# Patient Record
Sex: Male | Born: 2000 | Race: Asian | Hispanic: No | Marital: Single | Smoking: Never smoker
Health system: Southern US, Community
[De-identification: ages and names within clinical notes are randomized; demographics above are authoritative.]

## PROBLEM LIST (undated history)

## (undated) DIAGNOSIS — I429 Cardiomyopathy, unspecified: Secondary | ICD-10-CM

## (undated) HISTORY — PX: TRANSPOSITION OF GREAT VESSELS REPAIR: SHX815

## (undated) HISTORY — PX: OTHER SURGICAL HISTORY: SHX169

---
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
## 2019-02-15 ENCOUNTER — Other Ambulatory Visit: Payer: Self-pay

## 2019-02-15 ENCOUNTER — Encounter: Payer: Self-pay | Admitting: Emergency Medicine

## 2019-02-15 ENCOUNTER — Emergency Department: Payer: BLUE CROSS/BLUE SHIELD

## 2019-02-15 ENCOUNTER — Emergency Department
Admission: EM | Admit: 2019-02-15 | Discharge: 2019-02-15 | Disposition: A | Discharge status: Home / Self Care | Payer: BLUE CROSS/BLUE SHIELD | Attending: Emergency Medicine | Admitting: Emergency Medicine

## 2019-02-15 DIAGNOSIS — R0789 Other chest pain: Secondary | ICD-10-CM | POA: Insufficient documentation

## 2019-02-15 DIAGNOSIS — Z8679 Personal history of other diseases of the circulatory system: Secondary | ICD-10-CM | POA: Insufficient documentation

## 2019-02-15 DIAGNOSIS — R079 Chest pain, unspecified: Secondary | ICD-10-CM

## 2019-02-15 HISTORY — DX: Cardiomyopathy, unspecified: I42.9

## 2019-02-15 LAB — BASIC METABOLIC PANEL
Anion gap: 9 (ref 5–15)
BUN: 22 mg/dL — ABNORMAL HIGH (ref 6–20)
CO2: 24 mmol/L (ref 22–32)
Calcium: 9.5 mg/dL (ref 8.9–10.3)
Chloride: 107 mmol/L (ref 98–111)
Creatinine, Ser: 0.91 mg/dL (ref 0.61–1.24)
GFR calc Af Amer: >60 mL/min (ref >60)
GFR calc non Af Amer: >60 mL/min (ref >60)
Glucose, Bld: 90 mg/dL (ref 70–99)
Potassium: 3.5 mmol/L (ref 3.5–5.1)
Sodium: 140 mmol/L (ref 135–145)

## 2019-02-15 LAB — CBC
HCT: 54.1 % — ABNORMAL HIGH (ref 39.0–52.0)
Hemoglobin: 18.7 g/dL — ABNORMAL HIGH (ref 13.0–17.0)
MCH: 31.3 pg (ref 26.0–34.0)
MCHC: 34.6 g/dL (ref 30.0–36.0)
MCV: 90.5 fL (ref 80.0–100.0)
Platelets: 189 10*3/uL (ref 150–400)
RBC: 5.98 MIL/uL — ABNORMAL HIGH (ref 4.22–5.81)
RDW: 11.9 % (ref 11.5–15.5)
WBC: 6.4 10*3/uL (ref 4.0–10.5)
nRBC: 0 % (ref 0.0–0.2)

## 2019-02-15 LAB — TROPONIN I (HIGH SENSITIVITY)
Troponin I (High Sensitivity): 8 ng/L (ref <18)
Troponin I (High Sensitivity): 10 ng/L (ref <18)

## 2019-02-15 MED ORDER — SODIUM CHLORIDE 0.9% FLUSH: 3.0000 mL | Freq: Once | INTRAVENOUS | Status: DC

## 2019-02-15 NOTE — ED Provider Notes (Addendum)
[facility] Emergency Department Provider Note       Time seen: ----------------------------------------- 11:22 AM on 02/15/2019 -----------------------------------------   I have reviewed the triage vital signs and the nursing notes.  HISTORY   Chief Complaint Chest Pain    HPI Christian Walker is a 18 y.o. male with a history of open heart surgery as a child who presents to the ED for right-sided chest pain with deep breathing.  Patient describes pain 6 out of 10.  He denies any recent illness or other complaints.  Past Medical History:  Diagnosis Date  . 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   Past Surgical History:  Procedure Laterality Date  . open heart surgery     as a child--not sure why    Allergies Patient has no known allergies.  Social History Social History   Tobacco Use  . Smoking status: Never Smoker  . Smokeless tobacco: Never Used  Substance Use Topics  . Alcohol use: Not Currently  . Drug use: Not on file   Review of Systems Constitutional: Negative for fever. Cardiovascular: Positive for chest pain Respiratory: Negative for shortness of breath. Gastrointestinal: Negative for abdominal pain, vomiting and diarrhea. Musculoskeletal: Negative for back pain. Skin: Negative for rash. Neurological: Negative for headaches, focal weakness or numbness.  All systems negative/normal/unremarkable except as stated in the HPI  ____________________________________________   PHYSICAL EXAM:  VITAL SIGNS: ED Triage Vitals  Enc Vitals Group     BP 02/15/19 1025 129/81     Pulse Rate 02/15/19 1025 89     Resp 02/15/19 1025 16     Temp 02/15/19 1025 98.8 F (37.1 C)     Temp Source 02/15/19 1025 Oral     SpO2 02/15/19 1025 96 %     Weight --      Height --      Head Circumference --      Peak Flow --      Pain Score 02/15/19 1026 1     Pain  Loc --      Pain Edu? --      Excl. in [place]? --    Constitutional: Alert and oriented. Well appearing and in no distress. Cardiovascular: Normal rate, regular rhythm.  Systolic murmur Respiratory: Normal respiratory effort without tachypnea nor retractions. Breath sounds are clear and equal bilaterally. No wheezes/rales/rhonchi. Gastrointestinal: Soft and nontender. Normal bowel sounds Musculoskeletal: Nontender with normal range of motion in extremities. No lower extremity tenderness nor edema. Neurologic:  Normal speech and language. No gross focal neurologic deficits are appreciated.  Skin:  Skin is warm, dry and intact. No rash noted. Psychiatric: Mood and affect are normal. Speech and behavior are normal.  ____________________________________________  EKG: Interpreted by me.  Sinus rhythm the rate of 96 bpm, right bundle branch block, right axis deviation, normal QT  ____________________________________________  ED COURSE:  As part of my medical decision making, I reviewed the following data within the [place] History obtained from family if available, nursing notes, old chart and ekg, as well as notes from prior ED visits. Patient presented for chest pain, we will assess with labs and imaging as indicated at this time.   Procedures  Christian Walker was evaluated in Emergency Department on 02/15/2019 for the symptoms described in the history of present illness. He was evaluated in the context of the global COVID-19 pandemic, which  necessitated consideration that the patient might be at risk for infection with the SARS-CoV-2 virus that causes COVID-19. Institutional protocols and algorithms that pertain to the evaluation of patients at risk for COVID-19 are in a state of rapid change based on information released by regulatory bodies including the CDC and federal and state organizations. These policies and algorithms were followed during the patient's care in the ED.   ____________________________________________   LABS (pertinent positives/negatives)  Labs Reviewed  BASIC METABOLIC PANEL - Abnormal; Notable for the following components:      Result Value   BUN 22 (*)    All other components within normal limits  CBC - Abnormal; Notable for the following components:   RBC 5.98 (*)    Hemoglobin 18.7 (*)    HCT 54.1 (*)    All other components within normal limits  TROPONIN I (HIGH SENSITIVITY)  TROPONIN I (HIGH SENSITIVITY)    RADIOLOGY Images were viewed by me  Chest x-ray IMPRESSION: 1. No acute finding. 2. Postoperative heart. ____________________________________________   DIFFERENTIAL DIAGNOSIS   Musculoskeletal pain, GERD, arrhythmia, MI  FINAL ASSESSMENT AND PLAN  Chest pain   Plan: The patient had presented for chest pain. Patient's labs are reassuring. Patient's imaging was reassuring.  I have made an appointment for him with his cardiologist in 4 days.  He is having no symptoms at this time.  Is cleared for outpatient follow-up.   Ulice DashJohnathan E Mikaili Flippin, MD    Note: This note was generated in part or whole with voice recognition software. Voice recognition is usually quite accurate but there are transcription errors that can and very often do occur. I apologize for any typographical errors that were not detected and corrected.     Emily FilbertWilliams, Sandford Diop E, MD 02/15/19 1306    Emily FilbertWilliams, Lazariah Savard E, MD 02/15/19 [phone]

## 2019-02-15 NOTE — ED Triage Notes (Addendum)
Right side chest pain with deep breath.

## 2020-12-03 ENCOUNTER — Ambulatory Visit (HOSPITAL_COMMUNITY)
Admission: EM | Admit: 2020-12-03 | Discharge: 2020-12-03 | Disposition: A | Discharge status: Home / Self Care | Payer: Medicaid Other | Attending: Emergency Medicine | Admitting: Emergency Medicine

## 2020-12-03 ENCOUNTER — Encounter (HOSPITAL_COMMUNITY): Payer: Self-pay | Admitting: Emergency Medicine

## 2020-12-03 ENCOUNTER — Other Ambulatory Visit: Payer: Self-pay

## 2020-12-03 ENCOUNTER — Ambulatory Visit (INDEPENDENT_AMBULATORY_CARE_PROVIDER_SITE_OTHER): Payer: Medicaid Other

## 2020-12-03 DIAGNOSIS — W19XXXA Unspecified fall, initial encounter: Secondary | ICD-10-CM | POA: Diagnosis not present

## 2020-12-03 DIAGNOSIS — S52121A Displaced fracture of head of right radius, initial encounter for closed fracture: Secondary | ICD-10-CM | POA: Diagnosis not present

## 2020-12-03 DIAGNOSIS — M79631 Pain in right forearm: Secondary | ICD-10-CM | POA: Diagnosis not present

## 2020-12-03 NOTE — Discharge Instructions (Signed)
Fracture of your forearm near the elbow seen on x-ray. Wear sling provided until seen by orthopedics. Take Tylenol and/or ibuprofen and apply ice for discomfort.

## 2020-12-03 NOTE — ED Triage Notes (Signed)
Pt presents with right arm pain after slipping off pull up bar at the gym today. States fell on right arm.

## 2020-12-03 NOTE — ED Provider Notes (Signed)
[facility]    CSN: [id number] Arrival date & time: 12/03/20  1314      History   Chief Complaint Chief Complaint  Patient presents with  . Arm Injury    HPI Christian Walker is a 20 y.o. male.   Patient presents with right arm pain after injury around noon today. He states he was at the gym and went to jump to grab a pull up bar. His hand slipped, causing him to fall backwards. He landed with most of his weight on his right arm, landing on his elbow and forearm. He has had pain to his forearm since then, worse with moving his elbow or turning his hand. He denies pain into his wrist or hand or into the shoulder, or any numbness/tingling. The patient is right-handed. He applied ice but hasn't taken anything for it.   The history is provided by the patient.    Past Medical History:  Diagnosis Date  . 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   Past Surgical History:  Procedure Laterality Date  . open heart surgery     as a child--not sure why  . TRANSPOSITION OF GREAT VESSELS REPAIR         Home Medications    Prior to Admission medications   Medication Sig Start Date End Date Taking? Authorizing Provider  aspirin EC 81 MG tablet Take 81 mg by mouth daily.    [provider]    Family History Family History  Problem Relation Age of Onset  . Healthy Mother   . Healthy Father     Social History Social History   Tobacco Use  . Smoking status: Never Smoker  . Smokeless tobacco: Never Used  Substance Use Topics  . Alcohol use: Not Currently     Allergies   Patient has no known allergies.   Review of Systems Review of Systems  Musculoskeletal: Positive for arthralgias. Negative for joint swelling.  Skin: Negative for color change and wound.  Neurological: Negative for weakness and numbness.     Physical Exam Triage Vital Signs ED Triage Vitals  Enc Vitals  Group     BP 12/03/20 1420 127/70     Pulse Rate 12/03/20 1420 85     Resp 12/03/20 1420 16     Temp 12/03/20 1420 98.8 F (37.1 C)     Temp Source 12/03/20 1420 Oral     SpO2 12/03/20 1420 96 %     Weight --      Height --      Head Circumference --      Peak Flow --      Pain Score 12/03/20 1418 5     Pain Loc --      Pain Edu? --      Excl. in [place]? --    No data found.  Updated Vital Signs BP 127/70 (BP Location: Left Arm)   Pulse 85   Temp 98.8 F (37.1 C) (Oral)   Resp 16   SpO2 96%   Visual Acuity Right Eye Distance:   Left Eye Distance:   Bilateral Distance:    Right Eye Near:   Left Eye Near:    Bilateral Near:     Physical Exam Vitals and nursing note reviewed.  Constitutional:      General: He is not in acute distress. HENT:     Head: Normocephalic.  Eyes:     Pupils: Pupils are equal, round, and reactive to light.  Cardiovascular:     Pulses: Normal pulses.  Pulmonary:     Effort: Pulmonary effort is normal.  Musculoskeletal:     Right shoulder: No tenderness. Normal range of motion.     Right upper arm: No tenderness.     Right elbow: Decreased range of motion. Tenderness present.     Right forearm: Tenderness present. No swelling.     Right wrist: No tenderness. Normal range of motion.     Comments: Tenderness to right forearm, mainly to medial epicondyle and along ulna to about mid-shaft. No lateral epicondyle or olecranon tenderness. Full elbow ROM but pain with flexion beyond 45 degrees. Pain with pronation and supination. No pain with wrist flexion/extension or with shoulder ROM.   Skin:    General: Skin is warm.     Findings: No bruising or rash.  Neurological:     General: No focal deficit present.     Mental Status: He is alert.  Psychiatric:        Mood and Affect: Mood normal.      UC Treatments / Results  Labs (all labs ordered are listed, but only abnormal results are displayed) Labs Reviewed - No data to  display  EKG   Radiology DG Forearm Right  Result Date: 12/03/2020 CLINICAL DATA:  Right forearm pain after fall EXAM: RIGHT FOREARM - 2 VIEW COMPARISON:  None. FINDINGS: Frontal and lateral views of the right forearm are obtained. There is a minimally displaced intra-articular right radial head fracture, with associated moderate right elbow effusion. There is dorsal soft tissue swelling of the elbow. The remainder of the right forearm is unremarkable. Right wrist is well aligned. IMPRESSION: 1. Minimally displaced intra-articular right radial head fracture, with associated elbow effusion. Electronically Signed   By: Sharlet Salina M.D.   On: 12/03/2020 15:22    Procedures Procedures (including critical care time)  Medications Ordered in UC Medications - No data to display  Initial Impression / Assessment and Plan / UC Course  I have reviewed the triage vital signs and the nursing notes.  Pertinent labs & imaging results that were available during my care of the patient were reviewed by me and considered in my medical decision making (see chart for details).     Minimally displaced radial head fracture - will sling only given minimal displacement to prevent ROM loss. F/u with ortho - referral placed.   Final Clinical Impressions(s) / UC Diagnoses   Final diagnoses:  Closed displaced fracture of head of right radius, initial encounter     Discharge Instructions     Fracture of your forearm near the elbow seen on x-ray. Wear sling provided until seen by orthopedics. Take Tylenol and/or ibuprofen and apply ice for discomfort.     ED Prescriptions    None     PDMP not reviewed this encounter.   Estanislado Pandy, [place] 12/03/20 1554

## 2020-12-04 ENCOUNTER — Ambulatory Visit (INDEPENDENT_AMBULATORY_CARE_PROVIDER_SITE_OTHER): Payer: Medicaid Other

## 2020-12-04 ENCOUNTER — Ambulatory Visit (INDEPENDENT_AMBULATORY_CARE_PROVIDER_SITE_OTHER): Payer: Medicaid Other | Admitting: Orthopaedic Surgery

## 2020-12-04 ENCOUNTER — Encounter: Payer: Self-pay | Admitting: Orthopaedic Surgery

## 2020-12-04 DIAGNOSIS — S52121A Displaced fracture of head of right radius, initial encounter for closed fracture: Secondary | ICD-10-CM

## 2020-12-04 NOTE — Progress Notes (Signed)
Office Visit Note   Patient: Christian Walker           Date of Birth: [birth year]           MRN: [id number] Visit Date: 12/04/2020              Requested by: Estanislado Pandy, PA [address] [place],  [place] [address] PCP: Pcp, No   Assessment & Plan: Visit Diagnoses:  1. Closed displaced fracture of head of right radius, initial encounter     Plan: Impression is nondisplaced right radial head fracture.  We will continue to immobilize and support in a sling for about 7 to 10 days.  I demonstrated gentle range of motion exercises he can do on his own depending on his symptoms.  I would like to recheck him in 2 weeks with two-view x-rays of the right elbow.  Activity restrictions were reviewed today.  Follow-Up Instructions: Return in about 2 weeks (around 12/18/2020).   Orders:  Orders Placed This Encounter  Procedures  . XR Elbow 2 Views Right   No orders of the defined types were placed in this encounter.     Procedures: No procedures performed   Clinical Data: No additional findings.   Subjective: Chief Complaint  Patient presents with  . 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  He sustained a mechanical fall onto his right elbow yesterday.  He slipped at the gym.  He has had some sharp shooting pain that is worse with movement.  Denies any numbness and tingling.  He is currently wearing a sling.   Review of Systems  Constitutional: Negative.   All other systems reviewed and are negative.    Objective: Vital Signs: There were no vitals taken for this visit.  Physical Exam Vitals and nursing note reviewed.  Constitutional:      Appearance: He is well-developed.  HENT:     Head: Normocephalic and atraumatic.  Eyes:     Pupils: Pupils are equal, round, and reactive to light.  Pulmonary:     Effort: Pulmonary effort is normal.   Abdominal:     Palpations: Abdomen is soft.  Musculoskeletal:        General: Normal range of motion.     Cervical back: Neck supple.  Skin:    General: Skin is warm.  Neurological:     Mental Status: He is alert and oriented to person, place, and time.  Psychiatric:        Behavior: Behavior normal.        Thought Content: Thought content normal.        Judgment: Judgment normal.     Ortho Exam Right elbow shows guarding with attempted range of motion.  I do not feel any mechanical impediments to range of motion.  He is able to flex and extend and pronate and supinate decently. Specialty Comments:  No specialty comments available.  Imaging: DG Forearm Right  Result Date: 12/03/2020 CLINICAL DATA:  Right forearm pain after fall EXAM: RIGHT FOREARM - 2 VIEW COMPARISON:  None. FINDINGS: Frontal and lateral views of the right forearm are obtained. There is a minimally displaced intra-articular right radial head fracture, with associated moderate right elbow effusion. There is dorsal soft tissue swelling of the elbow. The remainder of the right forearm is unremarkable. Right wrist is well aligned.  IMPRESSION: 1. Minimally displaced intra-articular right radial head fracture, with associated elbow effusion. Electronically Signed   By: Sharlet Salina M.D.   On: 12/03/2020 15:22   XR Elbow 2 Views Right  Result Date: 12/04/2020 Nondisplaced radial head fracture    PMFS History: There are no problems to display for this patient.  Past Medical History:  Diagnosis Date  . 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 Healthy Mother   . Healthy Father     Past Surgical History:  Procedure Laterality Date  . open heart surgery     as a child--not sure why  . TRANSPOSITION OF GREAT VESSELS REPAIR     Social History   Occupational History  . Not on file  Tobacco Use  . Smoking status: Never Smoker  .  Smokeless tobacco: Never Used  Substance and Sexual Activity  . Alcohol use: Not Currently  . Drug use: Not on file  . Sexual activity: Not on file

## 2020-12-18 ENCOUNTER — Encounter: Payer: Self-pay | Admitting: Orthopaedic Surgery

## 2020-12-18 ENCOUNTER — Ambulatory Visit (INDEPENDENT_AMBULATORY_CARE_PROVIDER_SITE_OTHER): Payer: Medicaid Other

## 2020-12-18 ENCOUNTER — Ambulatory Visit (INDEPENDENT_AMBULATORY_CARE_PROVIDER_SITE_OTHER): Payer: Medicaid Other | Admitting: Orthopaedic Surgery

## 2020-12-18 ENCOUNTER — Other Ambulatory Visit: Payer: Self-pay

## 2020-12-18 DIAGNOSIS — S52121A Displaced fracture of head of right radius, initial encounter for closed fracture: Secondary | ICD-10-CM

## 2020-12-18 NOTE — Progress Notes (Signed)
   Office Visit Note   Patient: Christian Walker           Date of Birth: 2001-03-06           MRN: [id number] Visit Date: 12/18/2020              Requested by: No referring provider defined for this encounter. PCP: Pcp, No   Assessment & Plan: Visit Diagnoses:  1. Closed displaced fracture of head of right radius, initial encounter     Plan: Patient has done very well in the last couple weeks.  He is still limited to 5 pounds of lifting and no weightbearing through the hand.  We will recheck him in about 4 weeks with two-view x-rays of the right elbow.  Follow-Up Instructions: Return in about 4 weeks (around 01/15/2021).   Orders:  Orders Placed This Encounter  Procedures  . XR Elbow 2 Views Right   No orders of the defined types were placed in this encounter.     Procedures: No procedures performed   Clinical Data: No additional findings.   Subjective: Chief Complaint  Patient presents with  . Right Elbow - Pain    Christian Walker returns today for follow-up of right radial head fracture.  He reports very little pain and doing a lot better.   Review of Systems   Objective: Vital Signs: There were no vitals taken for this visit.  Physical Exam  Ortho Exam Right elbow shows full range of motion with flexion extension pronation supination.  Mild tenderness over the radial head. Specialty Comments:  No specialty comments available.  Imaging: XR Elbow 2 Views Right  Result Date: 12/18/2020 Stable minimally displaced right radial head fracture.    PMFS History: There are no problems to display for this patient.  Past Medical History:  Diagnosis Date  . 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 Healthy Mother   . Healthy Father     Past Surgical History:  Procedure Laterality Date  . open heart surgery     as a child--not sure why  . TRANSPOSITION OF GREAT VESSELS REPAIR     Social  History   Occupational History  . Not on file  Tobacco Use  . Smoking status: Never Smoker  . Smokeless tobacco: Never Used  Substance and Sexual Activity  . Alcohol use: Not Currently  . Drug use: Not on file  . Sexual activity: Not on file

## 2021-01-15 ENCOUNTER — Ambulatory Visit: Payer: 59 | Admitting: Orthopaedic Surgery

## 2021-06-25 ENCOUNTER — Other Ambulatory Visit: Payer: Self-pay

## 2021-06-25 ENCOUNTER — Observation Stay
Admission: EM | Admit: 2021-06-25 | Discharge: 2021-06-26 | Disposition: A | Discharge status: Home / Self Care | Payer: 59 | Attending: Family Medicine | Admitting: Family Medicine

## 2021-06-25 ENCOUNTER — Emergency Department: Payer: 59

## 2021-06-25 ENCOUNTER — Encounter: Payer: Self-pay | Admitting: Emergency Medicine

## 2021-06-25 DIAGNOSIS — J09X2 Influenza due to identified novel influenza A virus with other respiratory manifestations: Secondary | ICD-10-CM | POA: Insufficient documentation

## 2021-06-25 DIAGNOSIS — J9601 Acute respiratory failure with hypoxia: Secondary | ICD-10-CM | POA: Diagnosis not present

## 2021-06-25 DIAGNOSIS — R0602 Shortness of breath: Secondary | ICD-10-CM | POA: Diagnosis present

## 2021-06-25 DIAGNOSIS — Z20822 Contact with and (suspected) exposure to covid-19: Secondary | ICD-10-CM | POA: Insufficient documentation

## 2021-06-25 DIAGNOSIS — R0902 Hypoxemia: Secondary | ICD-10-CM

## 2021-06-25 DIAGNOSIS — Z7982 Long term (current) use of aspirin: Secondary | ICD-10-CM | POA: Insufficient documentation

## 2021-06-25 DIAGNOSIS — J101 Influenza due to other identified influenza virus with other respiratory manifestations: Secondary | ICD-10-CM | POA: Diagnosis present

## 2021-06-25 DIAGNOSIS — J111 Influenza due to unidentified influenza virus with other respiratory manifestations: Secondary | ICD-10-CM

## 2021-06-25 LAB — D-DIMER, QUANTITATIVE: D-Dimer, Quant: 0.3 ug/mL-FEU (ref 0.00–0.50)

## 2021-06-25 LAB — TROPONIN I (HIGH SENSITIVITY)
Troponin I (High Sensitivity): 5 ng/L (ref <18)
Troponin I (High Sensitivity): 6 ng/L (ref <18)

## 2021-06-25 LAB — BASIC METABOLIC PANEL
Anion gap: 13 (ref 5–15)
BUN: 12 mg/dL (ref 6–20)
CO2: 21 mmol/L — ABNORMAL LOW (ref 22–32)
Calcium: 9.1 mg/dL (ref 8.9–10.3)
Chloride: 104 mmol/L (ref 98–111)
Creatinine, Ser: 0.95 mg/dL (ref 0.61–1.24)
GFR, Estimated: >60 mL/min (ref >60)
Glucose, Bld: 158 mg/dL — ABNORMAL HIGH (ref 70–99)
Potassium: 3.5 mmol/L (ref 3.5–5.1)
Sodium: 138 mmol/L (ref 135–145)

## 2021-06-25 LAB — CBC
HCT: 51.3 % (ref 39.0–52.0)
Hemoglobin: 17.5 g/dL — ABNORMAL HIGH (ref 13.0–17.0)
MCH: 31.6 pg (ref 26.0–34.0)
MCHC: 34.1 g/dL (ref 30.0–36.0)
MCV: 92.8 fL (ref 80.0–100.0)
Platelets: 153 10*3/uL (ref 150–400)
RBC: 5.53 MIL/uL (ref 4.22–5.81)
RDW: 12.2 % (ref 11.5–15.5)
WBC: 7.9 10*3/uL (ref 4.0–10.5)
nRBC: 0 % (ref 0.0–0.2)

## 2021-06-25 LAB — PROCALCITONIN: Procalcitonin: <0.1 ng/mL

## 2021-06-25 LAB — RESP PANEL BY RT-PCR (FLU A&B, COVID) ARPGX2
Influenza A by PCR: POSITIVE — AB
Influenza B by PCR: NEGATIVE
SARS Coronavirus 2 by RT PCR: NEGATIVE

## 2021-06-25 LAB — LACTIC ACID, PLASMA: Lactic Acid, Venous: 1.9 mmol/L (ref 0.5–1.9)

## 2021-06-25 MED ORDER — ENOXAPARIN SODIUM 40 MG/0.4ML IJ SOSY: 40.0000 mg | PREFILLED_SYRINGE | INTRAMUSCULAR | Status: DC

## 2021-06-25 MED ORDER — ONDANSETRON HCL 4 MG PO TABS: 4.0000 mg | ORAL_TABLET | Freq: Four times a day (QID) | ORAL | Status: DC | PRN

## 2021-06-25 MED ORDER — HYDROCOD POLST-CPM POLST ER 10-8 MG/5ML PO SUER: 5.0000 mL | Freq: Two times a day (BID) | ORAL | Status: DC | PRN

## 2021-06-25 MED ORDER — ENOXAPARIN SODIUM 40 MG/0.4ML IJ SOSY
40.0000 mg | PREFILLED_SYRINGE | INTRAMUSCULAR | Status: DC
  Administered 2021-06-25: 40 mg via SUBCUTANEOUS
  Filled 2021-06-25: qty 0.4

## 2021-06-25 MED ORDER — ONDANSETRON HCL 4 MG/2ML IJ SOLN: 4.0000 mg | Freq: Four times a day (QID) | INTRAMUSCULAR | Status: DC | PRN

## 2021-06-25 MED ORDER — METHYLPREDNISOLONE SODIUM SUCC 125 MG IJ SOLR
125.0000 mg | Freq: Every day | INTRAMUSCULAR | Status: AC
  Administered 2021-06-25 – 2021-06-26 (×2): 125 mg via INTRAVENOUS
  Filled 2021-06-25 (×2): qty 2

## 2021-06-25 MED ORDER — ACETAMINOPHEN 325 MG PO TABS: 650.0000 mg | ORAL_TABLET | Freq: Four times a day (QID) | ORAL | Status: DC | PRN

## 2021-06-25 MED ORDER — GUAIFENESIN-DM 100-10 MG/5ML PO SYRP
10.0000 mL | ORAL_SOLUTION | ORAL | Status: DC | PRN
  Administered 2021-06-26: 10 mL via ORAL
  Filled 2021-06-25: qty 10

## 2021-06-25 MED ORDER — ACETAMINOPHEN 325 MG PO TABS
650.0000 mg | ORAL_TABLET | Freq: Once | ORAL | Status: AC | PRN
  Administered 2021-06-25: 650 mg via ORAL
  Filled 2021-06-25: qty 2

## 2021-06-25 MED ORDER — ACETAMINOPHEN 650 MG RE SUPP: 650.0000 mg | Freq: Four times a day (QID) | RECTAL | Status: DC | PRN

## 2021-06-25 NOTE — ED Notes (Signed)
Pt ambulatory to bathroom, able to void sans complications. 93% on RA while ambulating.

## 2021-06-25 NOTE — H&P (Addendum)
History and Physical   Amauri Hoang Maish NKN:[id number] DOB: [birth year] DOA: 06/25/2021  PCP: Pcp, No  Patient coming from: Urgent care center  I have personally briefly reviewed patient's old medical records in [facility] EMR.  Chief Concern: shortness of breath  HPI: Christian Walker is a 20 y.o. 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  He tested positive for influenza at [facility]. He was referred the ED due to hypoxia.  [facility] that [facility] his blood oxygen gas was 90%.  In the emergency department at [facility] it was 88% on room air.  He endorsed myalgia, weakness evening of Tuesday, 06/23/21, prompting him to present to the emergency department urgent care center for further evaluation.  Social history: He lives at home with his dad. He denies tobacco, etoh, recreatioanl drug use. 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  Vaccination history: He is vaccinated for covid 19, 2 and 1 booster, Pfizer   ROS: Constitutional: no weight change, no fever ENT/Mouth: no sore throat, no rhinorrhea Eyes: no eye pain, no vision changes Cardiovascular: no chest pain, no dyspnea,  no edema, no palpitations Respiratory: + cough, no sputum, no wheezing Gastrointestinal: no nausea, no vomiting, no diarrhea, no constipation Genitourinary: no urinary incontinence, no dysuria, no hematuria Musculoskeletal: no arthralgias, + myalgias Skin: no skin lesions, no pruritus, Neuro: + weakness, no loss of consciousness, no syncope Psych: no anxiety, no depression, + decrease appetite Heme/Lymph: no bruising, no bleeding  ED Course: Discussed with emergency medicine provider, patient requiring hospitalization for chief concerns of hypoxia.  Vitals in the emergency department showed T-max  of 103, respiration rate of 21, heart rate of 109, blood pressure 130/82, SPO2 of 90% on room air.  Patient has 97% SPO2 on 2 L nasal cannula.  Labs in the emergency department was remarkable for sodium 138, potassium 3.5, chloride 104, bicarb 21, BUN of 12, serum creatinine of 0.95, nonfasting blood glucose 158, WBC 7.9, hemoglobin 17.5, platelets 153.  High since he troponin was 5 and increased to 6.  D-dimer was 0.30.  Lactic acid was 1.9.  Procalcitonin was less than 0.10.  No medical intervention was given in the emergency department.  Assessment/Plan  Active Problems:   Acute hypoxemic respiratory failure (HCC)   Mild acute hypoxia-presumed secondary to influenza - Solu-Medrol 125 mg IV, daily, 2 doses total ordered - Tussionex, Robitussin - COVID/influenza A/influenza B PCR ordered - Tamiflu 75 mg p.o. twice daily ordered, for 5 days  Chart reviewed.   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 He has never used smokeless tobacco. He reports that he does not currently use alcohol. No history on file for drug use.  No Known  Allergies Family History  Problem Relation Age of Onset   Healthy Mother    Healthy Father    Family history: Family history reviewed and not pertinent  Prior to Admission medications   Medication Sig Start Date End Date Taking?  Authorizing Provider  aspirin EC 81 MG tablet Take 81 mg by mouth daily.    [provider]   Physical Exam: Vitals:   06/25/21 1945 06/25/21 2000 06/25/21 2015 06/25/21 2030  BP:      Pulse: 84 84 78 82  Resp: 19 (!) 22 18 20   Temp:      TempSrc:      SpO2: 95% 96% 98% 98%  Weight:      Height:       Constitutional: appears age-appropriate, NAD, calm, comfortable Eyes: PERRL, lids and conjunctivae normal ENMT: Mucous membranes are moist. Posterior pharynx clear of any exudate or lesions. Age-appropriate dentition. Hearing appropriate Neck: normal, supple, no masses, no thyromegaly Respiratory: clear to auscultation bilaterally, no wheezing, no crackles. Normal respiratory effort. No accessory muscle use.  Cardiovascular: Regular rate and rhythm, no murmurs / rubs / gallops. No extremity edema. 2+ pedal pulses. No carotid bruits.  Mid sternal vertical scar consistent with history of open heart surgery with keloid healing Abdomen: no tenderness, no masses palpated, no hepatosplenomegaly. Bowel sounds positive.  Musculoskeletal: no clubbing / cyanosis. No joint deformity upper and lower extremities. Good ROM, no contractures, no atrophy. Normal muscle tone.  Skin: no rashes, lesions, ulcers. No induration.  Multiple upper extremity skin tattoos that appeared to be well-healed. Neurologic: Sensation intact. Strength 5/5 in all 4.  Psychiatric: Normal judgment and insight. Alert and oriented x 3. Normal mood.   EKG: independently reviewed, showing sinus tachycardia with rate of 114, QTc 430  Chest x-ray on Admission: I personally reviewed and I agree with radiologist reading as below.  DG Chest 2 View  Result Date: 06/25/2021 CLINICAL DATA:  Shortness of breath. EXAM: CHEST - 2 VIEW COMPARISON:  02/15/2019 FINDINGS: Both lungs are clear. Again noted are postoperative changes in the heart with median sternotomy wires. Heart size is normal. No pleural effusions. No acute bone  abnormality. IMPRESSION: No active cardiopulmonary disease. Electronically Signed   By: Markus Daft M.D.   On: 06/25/2021 14:59    Labs on Admission: I have personally reviewed following labs CBC: Recent Labs  Lab 06/25/21 1519  WBC 7.9  HGB 17.5*  HCT 51.3  MCV 92.8  PLT 0000000   Basic Metabolic Panel: Recent Labs  Lab 06/25/21 1519  NA 138  K 3.5  CL 104  CO2 21*  GLUCOSE 158*  BUN 12  CREATININE 0.95  CALCIUM 9.1   GFR: Estimated Creatinine Clearance: 118 mL/min (by C-G formula based on SCr of 0.95 mg/dL).  Dr. Tobie Poet [facility]  If 7PM-7AM, please contact overnight-coverage provider If 7AM-7PM, please contact day coverage provider [email or web address]  06/25/2021, 10:37 PM

## 2021-06-25 NOTE — ED Triage Notes (Addendum)
Pt comes into the ED via POV c/o increased SHOB.  Pt was seen at [facility] today where they confirmed he is flu positive.  Pt sent here because his O2 saturation was between 88-90%.  No chest xray performed while at [place].  Pt ambulatory with even and unlabored respirations. Pt also states he has a lot of pain in his chest.  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

## 2021-06-25 NOTE — ED Provider Notes (Signed)
[facility] Emergency Department Provider Note   ____________________________________________   Event Date/Time   First MD Initiated Contact with Patient 06/25/21 1706     (approximate)  I have reviewed the triage vital signs and the nursing notes.   HISTORY  Chief Complaint Shortness of Breath and Chest Pain   HPI Christian Walker is a 20 y.o. male patient reports chest pain and shortness of breath.  Additionally has 103 fever.  He went to urgent care today and was sent here because his oxygen saturation was between 88 and 90%.  He was COVID-negative but flu positive at the urgent care.  He reports he has been sick for several days.  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         Past Medical History:  Diagnosis Date   Heart muscle disorder caused by another medical condition [facility])     Patient Active Problem List   Diagnosis Date Noted   Acute hypoxemic respiratory failure (HCC) 06/25/2021    Past Surgical History:  Procedure Laterality Date   open heart surgery     as a child--not sure why   TRANSPOSITION OF GREAT VESSELS REPAIR      Prior to Admission medications   Medication Sig Start Date End Date Taking? Authorizing Provider  aspirin EC 81 MG tablet Take 81 mg by mouth daily.   Yes [provider]    Allergies Patient has no known allergies.  Family History  Problem Relation Age of Onset   Healthy Mother    Healthy Father     Social History Social History   Tobacco Use   Smoking status: Never   Smokeless tobacco: Never  Substance Use Topics   Alcohol use: Not Currently    Review of Systems  Constitutional:  fever/chills Eyes: No visual changes. ENT: No sore throat. Cardiovascular:  chest  pain. Respiratory: shortness of breath. Gastrointestinal: No abdominal pain.  No nausea, no vomiting.  No diarrhea.  No constipation. Genitourinary: Negative for dysuria. Musculoskeletal: Negative for back pain. Skin: Negative for rash. Neurological: Negative for headaches, focal weakness   ____________________________________________   PHYSICAL EXAM:  VITAL SIGNS: ED Triage Vitals  Enc Vitals Group     BP 06/25/21 1503 130/82     Pulse Rate 06/25/21 1503 (!) 109     Resp 06/25/21 1503 (!) 21     Temp 06/25/21 1503 (!) 103 F (39.4 C)     Temp Source 06/25/21 1503 Oral     SpO2 06/25/21 1503 90 %     Weight 06/25/21 1423 157 lb (71.2 kg)     Height 06/25/21 1423 5\' 8"  (1.727 m)     Head Circumference --      Peak Flow --      Pain Score 06/25/21 1423 5     Pain Loc --      Pain Edu? --      Excl. in GC? --    Constitutional: Alert and oriented. Well appearing and in no acute distress. Eyes: Conjunctivae are normal. PERRL. EOMI. Head: Atraumatic. Nose: No congestion/rhinnorhea. Mouth/Throat: Mucous membranes are moist.  Oropharynx non-erythematous. Neck: No stridor. Cardiovascular: Normal rate, regular rhythm. Grossly normal heart sounds.  Good peripheral circulation.  Large midline scar on chest Respiratory: Normal respiratory effort.  No retractions. Lungs CTAB. Gastrointestinal: Soft and nontender. No distention. No abdominal bruits. No CVA tenderness. Musculoskeletal: No lower extremity tenderness nor edema.   Neurologic:  Normal speech and language. No gross focal neurologic deficits are appreciated. Skin:  Skin is warm, dry and intact. No rash noted. Psychiatric: Mood and affect are normal. Speech and behavior are normal.  ____________________________________________   LABS (all labs ordered are listed, but only abnormal results are displayed)  Labs Reviewed  RESP PANEL BY RT-PCR (FLU A&B, COVID) ARPGX2 - Abnormal; Notable for the following components:       Result Value   Influenza A by PCR POSITIVE (*)    All other components within normal limits  CBC - Abnormal; Notable for the following components:   Hemoglobin 17.5 (*)    All other components within normal limits  BASIC METABOLIC PANEL - Abnormal; Notable for the following components:   CO2 21 (*)    Glucose, Bld 158 (*)    All other components within normal limits  LACTIC ACID, PLASMA  PROCALCITONIN  D-DIMER, QUANTITATIVE (NOT AT [facility])  HIV ANTIBODY (ROUTINE TESTING W REFLEX)  CBC  D-DIMER, QUANTITATIVE  C-REACTIVE PROTEIN  COMPREHENSIVE METABOLIC PANEL  CBC WITH DIFFERENTIAL/PLATELET  TROPONIN I (HIGH SENSITIVITY)  TROPONIN I (HIGH SENSITIVITY)   ____________________________________________  EKG  EKG read interpreted by me shows sinus tachycardia at 114 left axis no acute ST-T wave changes ____________________________________________  RADIOLOGY Gertha Calkin, personally viewed and evaluated these images (plain radiographs) as part of my medical decision making, as well as reviewing the written report by the radiologist.  ED MD interpretation: Chest x-ray read by radiology reviewed by me is negative  Official radiology report(s): DG Chest 2 View  Result Date: 06/25/2021 CLINICAL DATA:  Shortness of breath. EXAM: CHEST - 2 VIEW COMPARISON:  02/15/2019 FINDINGS: Both lungs are clear. Again noted are postoperative changes in the heart with median sternotomy wires. Heart size is normal. No pleural effusions. No acute bone abnormality. IMPRESSION: No active cardiopulmonary disease. Electronically Signed   By: Markus Daft M.D.   On: 06/25/2021 14:59    ____________________________________________   PROCEDURES  Procedure(s) performed (including Critical Care):  Procedures   ____________________________________________   INITIAL IMPRESSION / ASSESSMENT AND PLAN / ED COURSE     Patient with shortness of breath and clear chest x-ray but negative D-dimer and  troponin.  He is positive for the flu.  We will have to get him in the hospital and treat him with oxygen and follow him carefully.  He may end up developing bacterial pneumonia though at the present time with a negative procalcitonin and normal white count this is very unlikely.         ____________________________________________   FINAL CLINICAL IMPRESSION(S) / ED DIAGNOSES  Final diagnoses:  Hypoxia  Influenza     ED Discharge Orders     None        Note:  This document was prepared using Dragon voice recognition software and may include unintentional dictation errors.    Nena Polio, MD 06/26/21 Benancio Deeds

## 2021-06-25 NOTE — ED Notes (Signed)
Lactic acid sent to lab with save label.

## 2021-06-26 ENCOUNTER — Observation Stay: Payer: 59

## 2021-06-26 ENCOUNTER — Encounter: Payer: Self-pay | Admitting: Internal Medicine

## 2021-06-26 DIAGNOSIS — J9601 Acute respiratory failure with hypoxia: Secondary | ICD-10-CM | POA: Diagnosis not present

## 2021-06-26 DIAGNOSIS — J101 Influenza due to other identified influenza virus with other respiratory manifestations: Secondary | ICD-10-CM | POA: Diagnosis not present

## 2021-06-26 LAB — CBC WITH DIFFERENTIAL/PLATELET
Abs Immature Granulocytes: 0.03 10*3/uL (ref 0.00–0.07)
Basophils Absolute: 0 10*3/uL (ref 0.0–0.1)
Basophils Relative: 0 %
Eosinophils Absolute: 0 10*3/uL (ref 0.0–0.5)
Eosinophils Relative: 0 %
HCT: 49.5 % (ref 39.0–52.0)
Hemoglobin: 16.9 g/dL (ref 13.0–17.0)
Immature Granulocytes: 0 %
Lymphocytes Relative: 11 %
Lymphs Abs: 0.8 10*3/uL (ref 0.7–4.0)
MCH: 31.8 pg (ref 26.0–34.0)
MCHC: 34.1 g/dL (ref 30.0–36.0)
MCV: 93.2 fL (ref 80.0–100.0)
Monocytes Absolute: 0.2 10*3/uL (ref 0.1–1.0)
Monocytes Relative: 3 %
Neutro Abs: 6.2 10*3/uL (ref 1.7–7.7)
Neutrophils Relative %: 86 %
Platelets: 141 10*3/uL — ABNORMAL LOW (ref 150–400)
RBC: 5.31 MIL/uL (ref 4.22–5.81)
RDW: 12.3 % (ref 11.5–15.5)
WBC: 7.3 10*3/uL (ref 4.0–10.5)
nRBC: 0 % (ref 0.0–0.2)

## 2021-06-26 LAB — COMPREHENSIVE METABOLIC PANEL
ALT: 22 U/L (ref 0–44)
AST: 28 U/L (ref 15–41)
Albumin: 4.3 g/dL (ref 3.5–5.0)
Alkaline Phosphatase: 51 U/L (ref 38–126)
Anion gap: 9 (ref 5–15)
BUN: 14 mg/dL (ref 6–20)
CO2: 26 mmol/L (ref 22–32)
Calcium: 8.9 mg/dL (ref 8.9–10.3)
Chloride: 104 mmol/L (ref 98–111)
Creatinine, Ser: 0.84 mg/dL (ref 0.61–1.24)
GFR, Estimated: >60 mL/min (ref >60)
Glucose, Bld: 173 mg/dL — ABNORMAL HIGH (ref 70–99)
Potassium: 4.6 mmol/L (ref 3.5–5.1)
Sodium: 139 mmol/L (ref 135–145)
Total Bilirubin: 1.2 mg/dL (ref 0.3–1.2)
Total Protein: 7.8 g/dL (ref 6.5–8.1)

## 2021-06-26 LAB — HEMOGLOBIN A1C
Hgb A1c MFr Bld: 5.8 % — ABNORMAL HIGH (ref 4.8–5.6)
Mean Plasma Glucose: 119.76 mg/dL

## 2021-06-26 LAB — HIV ANTIBODY (ROUTINE TESTING W REFLEX): HIV Screen 4th Generation wRfx: NONREACTIVE

## 2021-06-26 LAB — VITAMIN D 25 HYDROXY (VIT D DEFICIENCY, FRACTURES): Vit D, 25-Hydroxy: 11.84 ng/mL — ABNORMAL LOW (ref 30–100)

## 2021-06-26 LAB — C-REACTIVE PROTEIN: CRP: 5.3 mg/dL — ABNORMAL HIGH (ref <1.0)

## 2021-06-26 LAB — D-DIMER, QUANTITATIVE: D-Dimer, Quant: 0.51 ug/mL-FEU — ABNORMAL HIGH (ref 0.00–0.50)

## 2021-06-26 MED ORDER — OSELTAMIVIR PHOSPHATE 75 MG PO CAPS
75.0000 mg | ORAL_CAPSULE | Freq: Two times a day (BID) | ORAL | Status: DC
  Administered 2021-06-26 (×2): 75 mg via ORAL
  Filled 2021-06-26 (×3): qty 1

## 2021-06-26 MED ORDER — VITAMIN D (ERGOCALCIFEROL) 1.25 MG (50000 UNIT) PO CAPS: 50000.0000 [IU] | ORAL_CAPSULE | ORAL | 0 refills | Status: DC

## 2021-06-26 MED ORDER — OSELTAMIVIR PHOSPHATE 75 MG PO CAPS: 75.0000 mg | ORAL_CAPSULE | Freq: Two times a day (BID) | ORAL | 0 refills | Status: DC

## 2021-06-26 MED ORDER — VITAMIN D (ERGOCALCIFEROL) 1.25 MG (50000 UNIT) PO CAPS: 50000.0000 [IU] | ORAL_CAPSULE | ORAL | 0 refills | Status: AC

## 2021-06-26 MED ORDER — IOHEXOL 350 MG/ML SOLN
75.0000 mL | Freq: Once | INTRAVENOUS | Status: AC | PRN
  Administered 2021-06-26: 75 mL via INTRAVENOUS
  Filled 2021-06-26: qty 75

## 2021-06-26 MED ORDER — ASCORBIC ACID 500 MG PO TABS
1000.0000 mg | ORAL_TABLET | Freq: Every day | ORAL | Status: DC
  Administered 2021-06-26: 1000 mg via ORAL
  Filled 2021-06-26: qty 2

## 2021-06-26 MED ORDER — OSELTAMIVIR PHOSPHATE 75 MG PO CAPS: 75.0000 mg | ORAL_CAPSULE | Freq: Two times a day (BID) | ORAL | 0 refills | Status: AC

## 2021-06-26 NOTE — Progress Notes (Signed)
IV removed from patient. Discharge instructions given to patient. Verbalized understanding. No acute distress at this time. Patient escorted out to ED Lobby.

## 2021-06-26 NOTE — Progress Notes (Signed)
Patient ambulated to BR, O2 sats 92% on RA with exertion, tolerated well.

## 2021-06-26 NOTE — Discharge Summary (Signed)
Physician Discharge Summary  Coloma Wysocki MIW:[id number] DOB: [birth year] DOA: 06/25/2021  PCP: Pcp, No  Admit date: 06/25/2021 Discharge date: 06/26/2021  Time spent: 40 minutes  Recommendations for Outpatient Follow-up:  Follow outpatient CBC/CMP Follow a1c outpatient Follow vitamin D outpatient Ensure he establishes with PCP    Discharge Diagnoses:  Active Problems:   Acute hypoxemic respiratory failure (HCC)   Influenza Christian Walker   Discharge Condition: stable  Diet recommendation: heart healthy  Filed Weights   06/25/21 1423  Weight: 71.2 kg    History of present illness:  Christian Walker is Christian Walker 20 y.o. male with medical history significant for congenital heart disease status post transposition of great vessels repair, at age 55, and 2 more subsequent revisions, who [facility].   He tested positive for influenza at [facility]. He was referred the ED due to hypoxia.  [facility] blood oxygen gas was 90%.  In the emergency department at [facility] it was 88% on room air.   He was admitted for treatment of acute hypoxic resp failure 2/2 influenza Ed Rayson.  He's improved.  Plan for discharge and outpatient follow up.  Hospital Course:  Acute Hypoxic Respiratory Failure 2/2 Influenza Mica Releford Resolved - maintaining sats on RA with ambulation, feeling much better this AM Continue tamiflu CT PE study given elevated d dimer without findings concerning for PE Plan for discharge with tamiflu and symptomatic treatment Elevated inflammatory markers, d dimer, likely related to inflammation with flu  Vit D def Prescribe vit d replacement, needs outpatient follow up  Elevated Blood Sugar Possibly related to steroids A1c pending Needs to follow this outpatient  Hx Congential heart disease s/p transposition of great vessels repair  Needs PCP, discussed with RN to arrange with  TOC  Procedures: none  Consultations: none  Discharge Exam: Vitals:   06/26/21 0600 06/26/21 0800  BP: 117/67 111/65  Pulse: 68 68  Resp: 17 16  Temp:    SpO2: 96% 94%   Feels better, ready for dischrge  General: No acute distress. Cardiovascular: Heart sounds show Willette Mudry regular rate, and rhythm. Lungs: Clear to auscultation bilaterally Abdomen: Soft, nontender, nondistended  Neurological: Alert and oriented 3. Moves all extremities 4 . Cranial nerves II through XII grossly intact. Skin: Warm and dry. No rashes or lesions. Extremities: No clubbing or cyanosis. No edema.   Discharge Instructions   Discharge Instructions     Call MD for:  difficulty breathing, headache or visual disturbances   Complete by: As directed    Call MD for:  extreme fatigue   Complete by: As directed    Call MD for:  hives   Complete by: As directed    Call MD for:  persistant dizziness or light-headedness   Complete by: As directed    Call MD for:  persistant nausea and vomiting   Complete by: As directed    Call MD for:  redness, tenderness, or signs of infection (pain, swelling, redness, odor or green/yellow discharge around incision site)   Complete by: As directed    Call MD for:  severe uncontrolled pain   Complete by: As directed    Call MD for:  temperature >100.4   Complete by: As directed    Diet - low sodium heart healthy   Complete by: As directed    Discharge instructions   Complete by: As directed    You were seen for shortness of  breath due to influenza [name].  Your symptoms are improved at this time.  We'll send you home with tamiflu for 4 more days.  Use ibuprofen or tylenol as needed for fever or muscle aches.   Your CT scan did not have concerning findings.  You blood sugar was high, this was probably due to steroids.  We sent an A1c which is pending.   You have low vitamin D levels.  I've sent you with Rogerio Boutelle prescription for once weekly vitamin D.  You'll need follow up  with an outpatient doctor for further evaluation.    Please establish with Ajay Strubel PCP and follow up this hospital stay.  Return for new, recurrent, or worsening symptoms.  Please ask your PCP to request records from this hospitalization so they know what was done and what the next steps will be.   Increase activity slowly   Complete by: As directed       Allergies as of 06/26/2021   No Known Allergies      Medication List     TAKE these medications    aspirin EC 81 MG tablet Take 81 mg by mouth daily.   oseltamivir 75 MG capsule Commonly known as: TAMIFLU Take 1 capsule (75 mg total) by mouth 2 (two) times daily for 4 days.   Vitamin D (Ergocalciferol) 1.25 MG (50000 UNIT) Caps capsule Commonly known as: DRISDOL Take 1 capsule (50,000 Units total) by mouth every 7 (seven) days for 6 doses.       No Known Allergies    The results of significant diagnostics from this hospitalization (including imaging, microbiology, ancillary and laboratory) are listed below for reference.    Significant Diagnostic Studies: DG Chest 2 View  Result Date: 06/25/2021 CLINICAL DATA:  Shortness of breath. EXAM: CHEST - 2 VIEW COMPARISON:  02/15/2019 FINDINGS: Both lungs are clear. Again noted are postoperative changes in the heart with median sternotomy wires. Heart size is normal. No pleural effusions. No acute bone abnormality. IMPRESSION: No active cardiopulmonary disease. Electronically Signed   By: Richarda Overlie M.D.   On: 06/25/2021 14:59   CT Angio Chest Pulmonary Embolism (PE) W or WO Contrast  Result Date: 06/26/2021 CLINICAL DATA:  19 year old male with history of shortness of breath. Evaluate for pulmonary embolism. EXAM: CT ANGIOGRAPHY CHEST WITH CONTRAST TECHNIQUE: Multidetector CT imaging of the chest was performed using the standard protocol during bolus administration of intravenous contrast. Multiplanar CT image reconstructions and MIPs were obtained to evaluate the vascular anatomy.  CONTRAST:  69mL OMNIPAQUE IOHEXOL 350 MG/ML SOLN COMPARISON:  No priors. FINDINGS: Cardiovascular: Complex congenital heart disease is noted. Specifically, there is d-transposition of the great arteries, as well as large atrial septal defect (axial image 191 of series 6), and large ventricular septal defect (axial image 187 of series 6). There appears to have been prior atrial switch operation, with Gilles Trimpe baffle which returns blood flow from the inferior vena cava into the pulmonic trunk directed toward the left main pulmonary artery. Superior vena cava connects to the pulmonic trunk with blood flow directed into the right main pulmonary artery. This causes unusual mixing of opacified and unopacified blood throughout the pulmonary arterial circulation. With these limitations in mind, no definite pulmonary embolism is identified. The systemic ventricle (right ventricle) demonstrates hypertrophy. No pericardial fluid, thickening or pericardial calcification. No atherosclerotic calcifications in the thoracic aorta or the coronary arteries. 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 Mediastinum/Nodes: No pathologically enlarged mediastinal or hilar lymph nodes. Esophagus is unremarkable in appearance. No axillary lymphadenopathy. Lungs/Pleura: Dependent areas of subsegmental atelectasis are noted in the lower lobes of the lungs bilaterally. No consolidative airspace disease. No pleural effusions. No definite suspicious appearing pulmonary nodules or masses are noted. Upper Abdomen: Visualized portions are unremarkable. Specifically, there is normal abdominal situs. Musculoskeletal: Median sternotomy wires. There are no aggressive appearing lytic or blastic lesions noted in the visualized portions of the skeleton. Review of the MIP images confirms the above findings. IMPRESSION: 1. Complex  congenital heart disease (transposition of the great vessels with large atrial septal and ventricular septal defects status post correction with atrial switch procedure, which confound today's study. With these limitations in mind, no definite pulmonary embolism is identified, with discussion, as above. 2. Mild dependent subsegmental atelectasis in the lower lobes of the lungs bilaterally. Electronically Signed   By: Trudie Reed M.D.   On: 06/26/2021 10:09    Microbiology: Recent Results (from the past 240 hour(s))  Resp Panel by RT-PCR (Flu Felipa Laroche&B, Covid) Nasopharyngeal Swab     Status: Abnormal   Collection Time: 06/25/21  9:23 PM   Specimen: Nasopharyngeal Swab; Nasopharyngeal(NP) swabs in vial transport medium  Result Value Ref Range Status   SARS Coronavirus 2 by RT PCR NEGATIVE NEGATIVE Final    Comment: (NOTE) SARS-CoV-2 target nucleic acids are NOT DETECTED.  The SARS-CoV-2 RNA is generally detectable in upper respiratory specimens during the acute phase of infection. The lowest concentration of SARS-CoV-2 viral copies this assay can detect is 138 copies/mL. Greysen Devino negative result does not preclude SARS-Cov-2 infection and should not be used as the sole basis for treatment or other patient management decisions. Jaselynn Tamas negative result may occur with  improper specimen collection/handling, submission of specimen other than nasopharyngeal swab, presence of viral mutation(s) within the areas targeted by this assay, and inadequate number of viral copies(<138 copies/mL). Jlon Betker negative result must be combined with clinical observations, patient history, and epidemiological information. The expected result is Negative.  Fact Sheet for Patients:  BloggerCourse.com  Fact Sheet for Healthcare Providers:  SeriousBroker.it  This test is no t yet approved or cleared by the [place] FDA and  has been authorized for detection and/or diagnosis of  SARS-CoV-2 by FDA under an Emergency Use Authorization (EUA). This EUA will remain  in effect (meaning this test can be used) for the duration of the COVID-19 declaration under Section 564(b)(1) of the Act, 21 U.S.C.section 360bbb-3(b)(1), unless the authorization is terminated  or revoked sooner.       Influenza Eunice Winecoff by PCR POSITIVE (Kobe Ofallon) NEGATIVE Final   Influenza B by PCR NEGATIVE NEGATIVE Final    Comment: (NOTE) The Xpert Xpress SARS-CoV-2/FLU/RSV plus assay is intended as an aid in the diagnosis of influenza from Nasopharyngeal swab specimens and should not be used as Shailene Demonbreun sole basis for treatment. Nasal washings and aspirates are unacceptable for Xpert Xpress SARS-CoV-2/FLU/RSV testing.  Fact Sheet for Patients: BloggerCourse.com  Fact Sheet for Healthcare Providers: SeriousBroker.it  This test is not yet approved or cleared by the [place] FDA and has been authorized for detection and/or diagnosis of SARS-CoV-2 by FDA under an Emergency Use Authorization (EUA). This EUA will remain in effect (meaning this test can be used) for the duration of the COVID-19 declaration under Section 564(b)(1) of the Act, 21 U.S.C. section 360bbb-3(b)(1), unless the authorization is terminated or revoked.  Performed  at [facility], [address] [place], [place] [address]      Labs: Basic Metabolic Panel: Recent Labs  Lab 06/25/21 1519 06/26/21 0433  NA 138 139  K 3.5 4.6  CL 104 104  CO2 21* 26  GLUCOSE 158* 173*  BUN 12 14  CREATININE 0.95 0.84  CALCIUM 9.1 8.9   Liver Function Tests: Recent Labs  Lab 06/26/21 0433  AST 28  ALT 22  ALKPHOS 51  BILITOT 1.2  PROT 7.8  ALBUMIN 4.3   No results for input(s): LIPASE, AMYLASE in the last 168 hours. No results for input(s): AMMONIA in the last 168 hours. CBC: Recent Labs  Lab 06/25/21 1519 06/26/21 0433  WBC 7.9 7.3  NEUTROABS  --  6.2  HGB 17.5* 16.9   HCT 51.3 49.5  MCV 92.8 93.2  PLT 153 141*   Cardiac Enzymes: No results for input(s): CKTOTAL, CKMB, CKMBINDEX, TROPONINI in the last 168 hours. BNP: BNP (last 3 results) No results for input(s): BNP in the last 8760 hours.  ProBNP (last 3 results) No results for input(s): PROBNP in the last 8760 hours.  CBG: No results for input(s): GLUCAP in the last 168 hours.     Signed:  Lacretia Nicks MD.  [facility] 06/26/2021, 11:37 AM

## 2021-06-26 NOTE — TOC Initial Note (Addendum)
Transition of Care [facility]) - Initial/Assessment Note    Patient Details  Name: Christian Walker MRN: [id number] Date of Birth: [birth year]  [facility]) CM/SW Contact:    Marina Goodell Phone Number: [phone] 06/26/2021, 12:16 PM  Clinical Narrative:                  Patient presents to [facility] due to  Acute hypoxemic respiratory failure (HCC and Influenza A. Patient is independent with all ADLs and is a Physicist, medical at [employer]. CSW gave patient contact information for PCPs in the area that take his insurance and are accepting new patients and  CSW also encouraged the patient to use the student clinic at [facility] if that was easier to access. CSW spoke with patient who stated he would contact PCPs to schedule an appointment. CSW updated ED RN.  Expected Discharge Plan: Home/Self Care Barriers to Discharge: ED PCP establishment   Patient Goals and CMS Choice Patient states their goals for this hospitalization and ongoing recovery are:: Schedule appointment with PCP      Expected Discharge Plan and Services Expected Discharge Plan: Home/Self Care In-house Referral: Clinical Social Work       Expected Discharge Date: 06/26/21                                    Prior Living Arrangements/Services   Lives with:: Parents (Osmond,hoang (Father)   [phone] (Mobile)) Patient language and need for interpreter reviewed:: Yes Do you feel safe going back to the place where you live?: Yes      Need for Family Participation in Patient Care: No (Comment) Care giver support system in place?: Yes (comment)   Criminal Activity/Legal Involvement Pertinent to Current Situation/Hospitalization: No - Comment as needed  Activities of Daily Living      Permission Sought/Granted Permission sought to share information with : Family Supports Permission granted to share information with : Yes, Verbal Permission Granted  Share Information with NAME: Doucette,hoang (Father)    [phone] (Mobile)           Emotional Assessment Appearance:: Appears stated age   Affect (typically observed): Stable Orientation: : Oriented to  Time, Oriented to Place, Oriented to Self Alcohol / Substance Use: Not Applicable Psych Involvement: No (comment)  Admission diagnosis:  Acute hypoxemic respiratory failure (HCC) [J96.01] Patient Active Problem List   Diagnosis Date Noted   Influenza A 06/26/2021   Acute hypoxemic respiratory failure (HCC) 06/25/2021   PCP:  Pcp, No Pharmacy:   [id number] - Closed - [place], [place] - [address] [place] [address] Phone: [phone] Fax: [phone]     Social Determinants of Health (SDOH) Interventions    Readmission Risk Interventions No flowsheet data found.

## 2023-02-11 IMAGING — CT CT ANGIO CHEST
2 of 6 series · 18 of 46 positions shown · IV contrast (APPLIED)
Comparison: No priors.

CLINICAL DATA: 20-year-old male with history of shortness of
breath. Evaluate for pulmonary embolism.

EXAM:
CT ANGIOGRAPHY CHEST WITH CONTRAST
TECHNIQUE: Multidetector CT imaging of the chest was performed using the
standard protocol during bolus administration of intravenous
contrast. Multiplanar CT image reconstructions and MIPs were
obtained to evaluate the vascular anatomy.
CONTRAST:  75mL OMNIPAQUE IOHEXOL 350 MG/ML SOLN

[Series 6: thins · axial · 0.58mm/px · z∈[+646,+906]mm · 15 of 356 slices shown]
[im 15/356  lung]
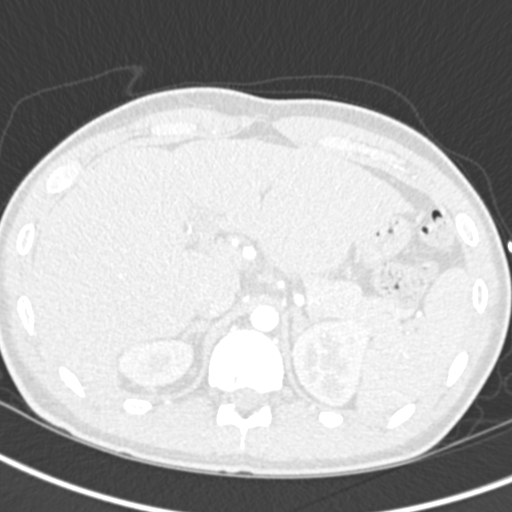
[im 45/356  soft-tissue]
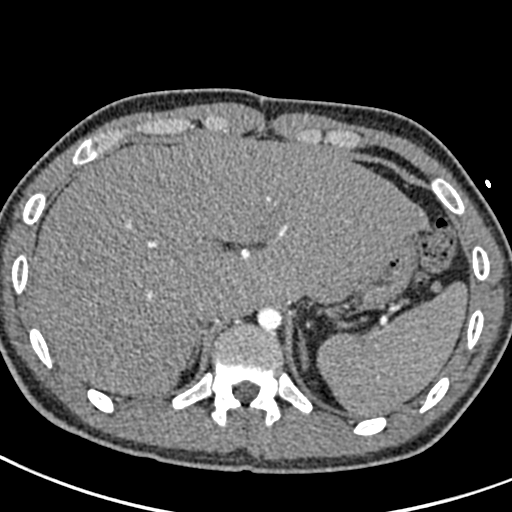
[im 60/356  lung]
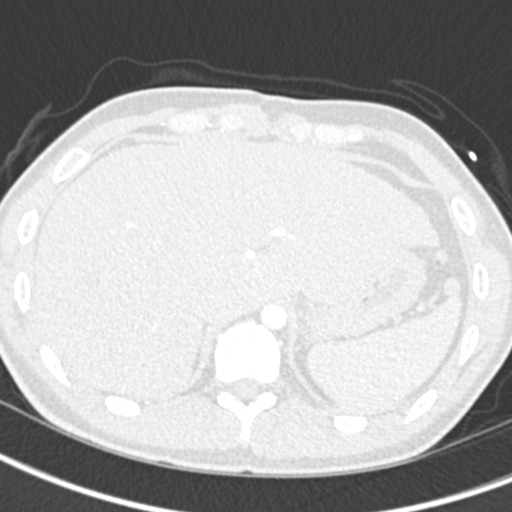
[im 89/356  soft-tissue]
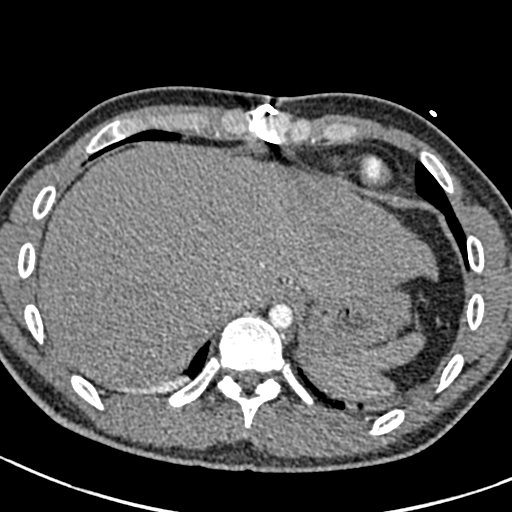
[im 104/356  lung]
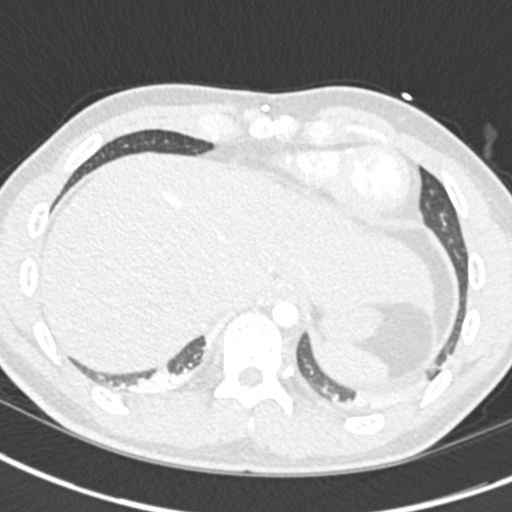
[im 134/356  soft-tissue]
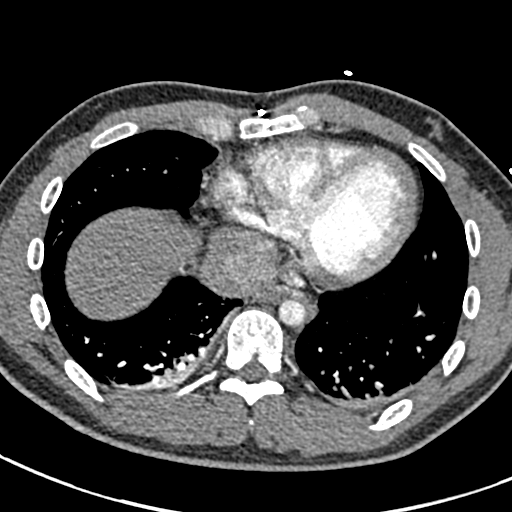
[im 148/356  lung]
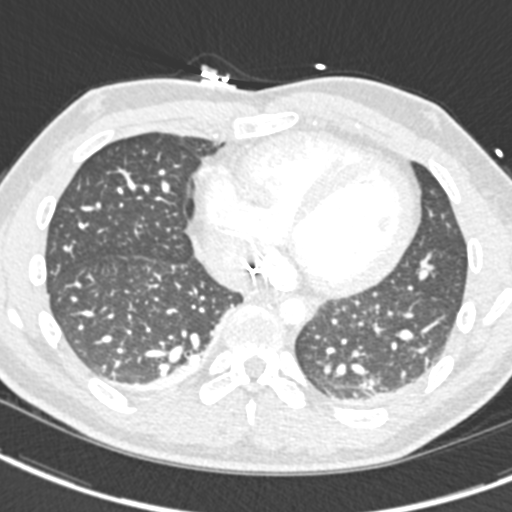
[im 178/356  soft-tissue]
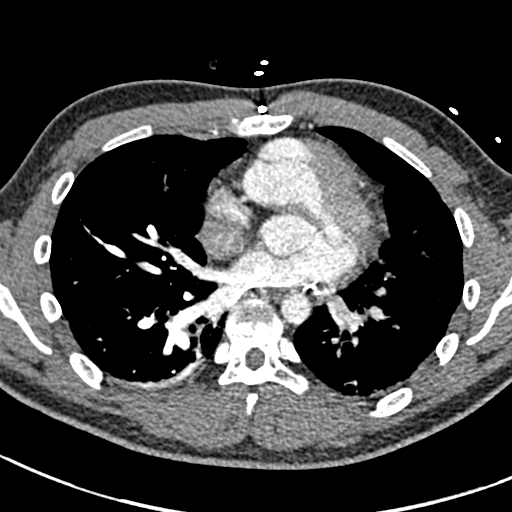
[im 208/356  lung]
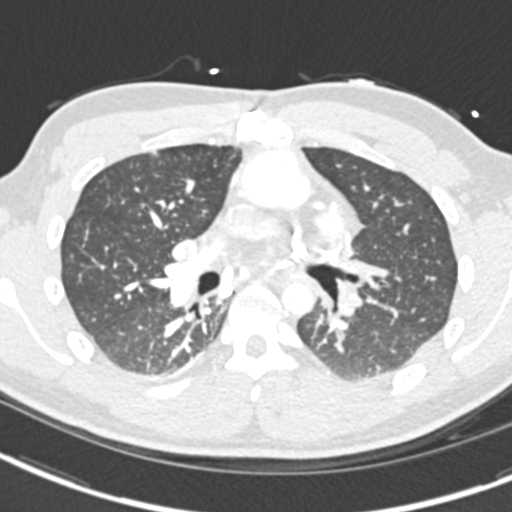
[im 222/356  soft-tissue]
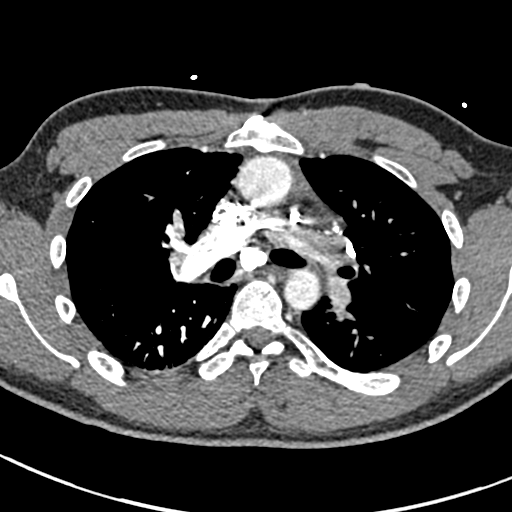
[im 252/356  lung]
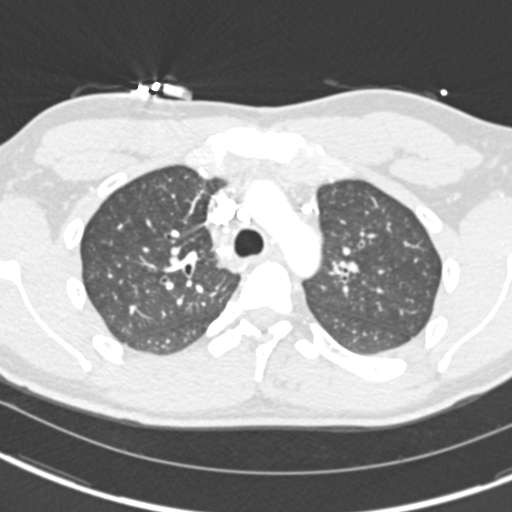
[im 267/356  soft-tissue]
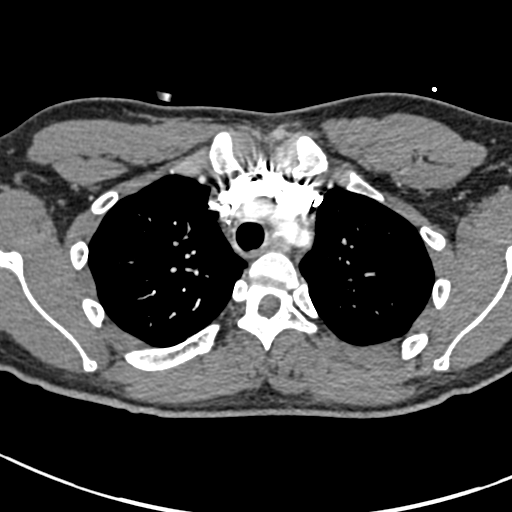
[im 296/356  lung]
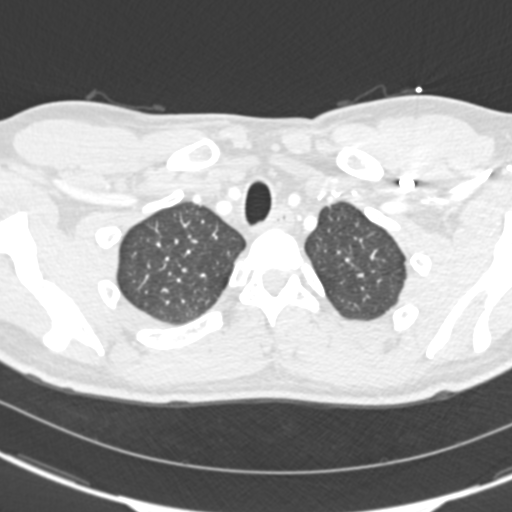
[im 311/356  soft-tissue]
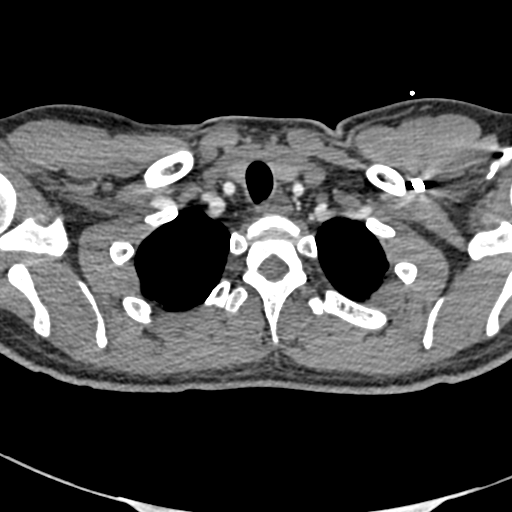
[im 341/356  lung]
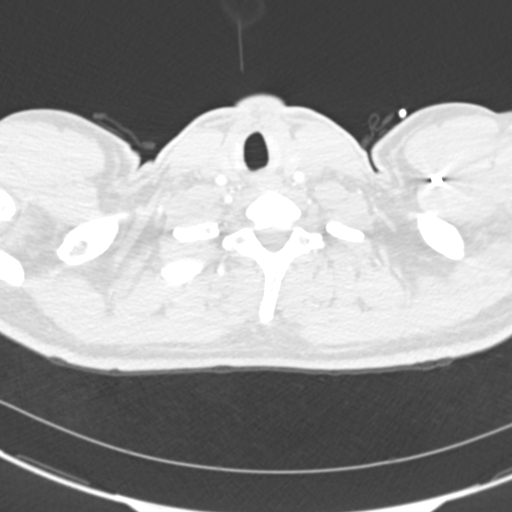

[Series 8: coronal mpr · coronal · 0.56mm/px · 3 of 75 slices shown]
[im 19/75  soft-tissue]
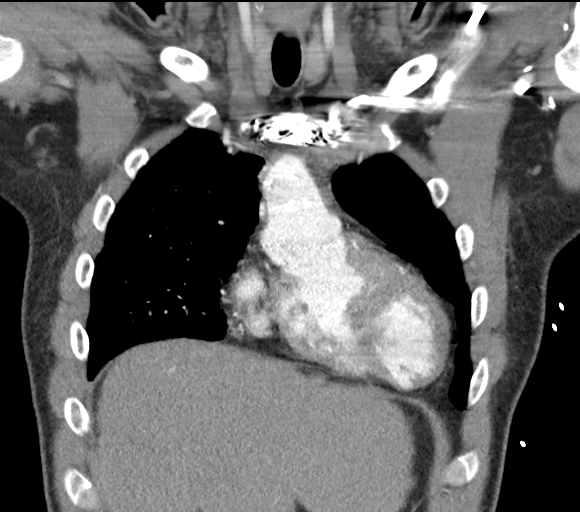
[im 38/75  soft-tissue]
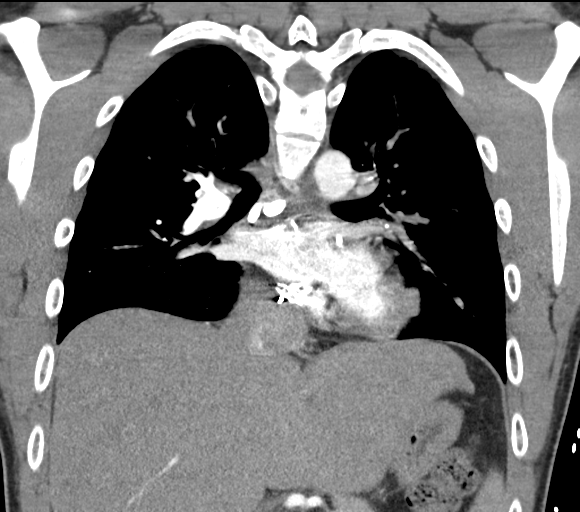
[im 56/75  soft-tissue]
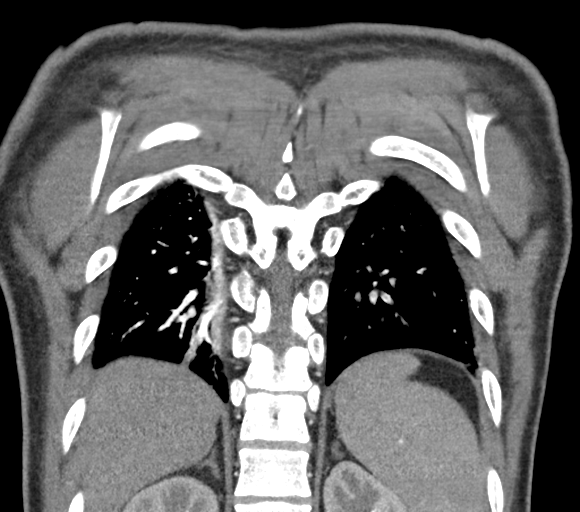

[18 of 46 positions shown; findings below may reference images not displayed]

FINDINGS: Cardiovascular: Complex congenital heart disease is noted.
Specifically, there is d-transposition of the great arteries, as
well as large atrial septal defect (axial image 191 of series 6),
and large ventricular septal defect (axial image 187 of series 6).
There appears to have been prior atrial switch operation, with a
baffle which returns blood flow from the inferior vena cava into the
pulmonic trunk directed toward the left main pulmonary artery.
Superior vena cava connects to the pulmonic trunk with blood flow
directed into the right main pulmonary artery. This causes unusual
mixing of opacified and unopacified blood throughout the pulmonary
arterial circulation. With these limitations in mind, no definite
pulmonary embolism is identified. The systemic ventricle (right
ventricle) demonstrates hypertrophy. No pericardial fluid,
thickening or pericardial calcification. No atherosclerotic
calcifications in the thoracic aorta or the coronary arteries.
Several venous collaterals are noted in the mediastinum, 1 of which
is very large and appears to communicate via anomalous connection to
the right-side of the left atrium.

Mediastinum/Nodes: No pathologically enlarged mediastinal or hilar
lymph nodes. Esophagus is unremarkable in appearance. No axillary
lymphadenopathy.

Lungs/Pleura: Dependent areas of subsegmental atelectasis are noted
in the lower lobes of the lungs bilaterally. No consolidative
airspace disease. No pleural effusions. No definite suspicious
appearing pulmonary nodules or masses are noted.

Upper Abdomen: Visualized portions are unremarkable. Specifically,
there is normal abdominal situs.

Musculoskeletal: Median sternotomy wires. There are no aggressive
appearing lytic or blastic lesions noted in the visualized portions
of the skeleton.

Review of the MIP images confirms the above findings.
IMPRESSION: 1. Complex congenital heart disease (transposition of the great
vessels with large atrial septal and ventricular septal defects
status post correction with atrial switch procedure, which confound
today's study. With these limitations in mind, no definite pulmonary
embolism is identified, with discussion, as above.
2. Mild dependent subsegmental atelectasis in the lower lobes of the
lungs bilaterally.

## 2024-05-29 ENCOUNTER — Encounter: Payer: Self-pay | Admitting: Cardiovascular Disease
